# Patient Record
Sex: Male | Born: 1990 | Race: Black or African American | Hispanic: No | Marital: Single | Smoking: Never smoker
Health system: Southern US, Community
[De-identification: ages and names within clinical notes are randomized; demographics above are authoritative.]

---
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
## 2016-12-24 ENCOUNTER — Emergency Department (HOSPITAL_COMMUNITY)
Admission: EM | Admit: 2016-12-24 | Discharge: 2016-12-24 | Disposition: A | Discharge status: Home / Self Care | Payer: No Typology Code available for payment source | Attending: Emergency Medicine | Admitting: Emergency Medicine

## 2016-12-24 ENCOUNTER — Encounter (HOSPITAL_COMMUNITY): Payer: Self-pay | Admitting: Nurse Practitioner

## 2016-12-24 ENCOUNTER — Emergency Department (HOSPITAL_COMMUNITY): Payer: No Typology Code available for payment source

## 2016-12-24 DIAGNOSIS — S0990XA Unspecified injury of head, initial encounter: Secondary | ICD-10-CM | POA: Diagnosis present

## 2016-12-24 DIAGNOSIS — Y929 Unspecified place or not applicable: Secondary | ICD-10-CM | POA: Insufficient documentation

## 2016-12-24 DIAGNOSIS — S39012A Strain of muscle, fascia and tendon of lower back, initial encounter: Secondary | ICD-10-CM | POA: Insufficient documentation

## 2016-12-24 DIAGNOSIS — Y999 Unspecified external cause status: Secondary | ICD-10-CM | POA: Insufficient documentation

## 2016-12-24 DIAGNOSIS — Y9389 Activity, other specified: Secondary | ICD-10-CM | POA: Diagnosis not present

## 2016-12-24 DIAGNOSIS — T148XXA Other injury of unspecified body region, initial encounter: Secondary | ICD-10-CM

## 2016-12-24 MED ORDER — CYCLOBENZAPRINE HCL 10 MG PO TABS: 10.0000 mg | ORAL_TABLET | Freq: Two times a day (BID) | ORAL | 0 refills | Status: AC | PRN

## 2016-12-24 MED ORDER — IBUPROFEN 200 MG PO TABS
600.0000 mg | ORAL_TABLET | Freq: Once | ORAL | Status: AC
  Administered 2016-12-24: 600 mg via ORAL
  Filled 2016-12-24: qty 3
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
